# Patient Record
Sex: Male | Born: 1970 | Race: White | Hispanic: No | Marital: Married
Health system: Southern US, Community
[De-identification: ages and names within clinical notes are randomized; demographics above are authoritative.]

---

## 1998-09-17 ENCOUNTER — Ambulatory Visit (HOSPITAL_BASED_OUTPATIENT_CLINIC_OR_DEPARTMENT_OTHER): Admission: RE | Admit: 1998-09-17 | Discharge: 1998-09-17 | Payer: Self-pay | Admitting: General Surgery

## 1999-04-23 ENCOUNTER — Emergency Department (HOSPITAL_COMMUNITY): Admission: EM | Admit: 1999-04-23 | Discharge: 1999-04-23 | Payer: Self-pay

## 2005-05-26 ENCOUNTER — Emergency Department: Payer: Self-pay | Admitting: Emergency Medicine

## 2006-12-24 IMAGING — CR RIGHT FOOT COMPLETE - 3+ VIEW
1 series · 3 of 3 positions shown · non-contrast
Comparison: none

REASON FOR EXAM: Injury
COMMENTS:  LMP: (Male)

PROCEDURE:     DXR - DXR FOOT RT COMPLETE W/OBLIQUES  - May 26, 2005  [DATE]
RESULT:       Three views reveals no acute fractures.  No dislocations.  The
joint spaces appear intact.

[Series 1: view not recorded · 0.17mm/px · 3 of 3 slices shown]
[im 1/3]
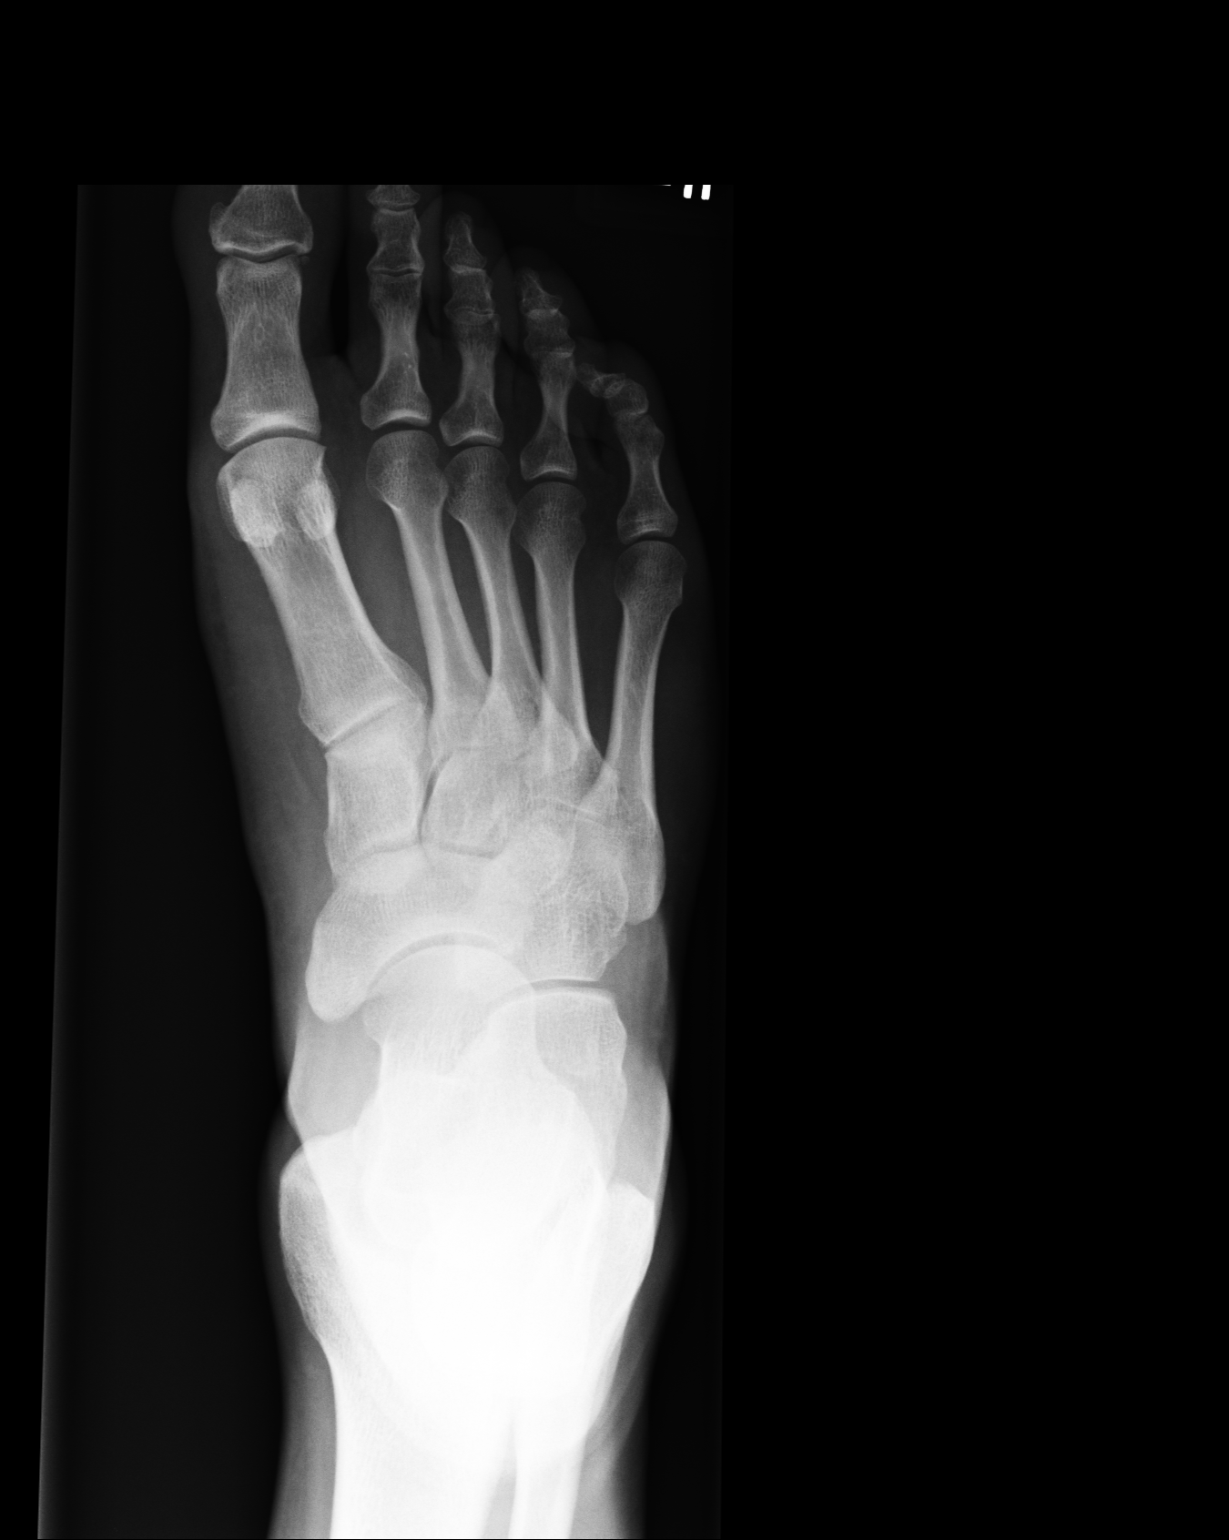
[im 2/3]
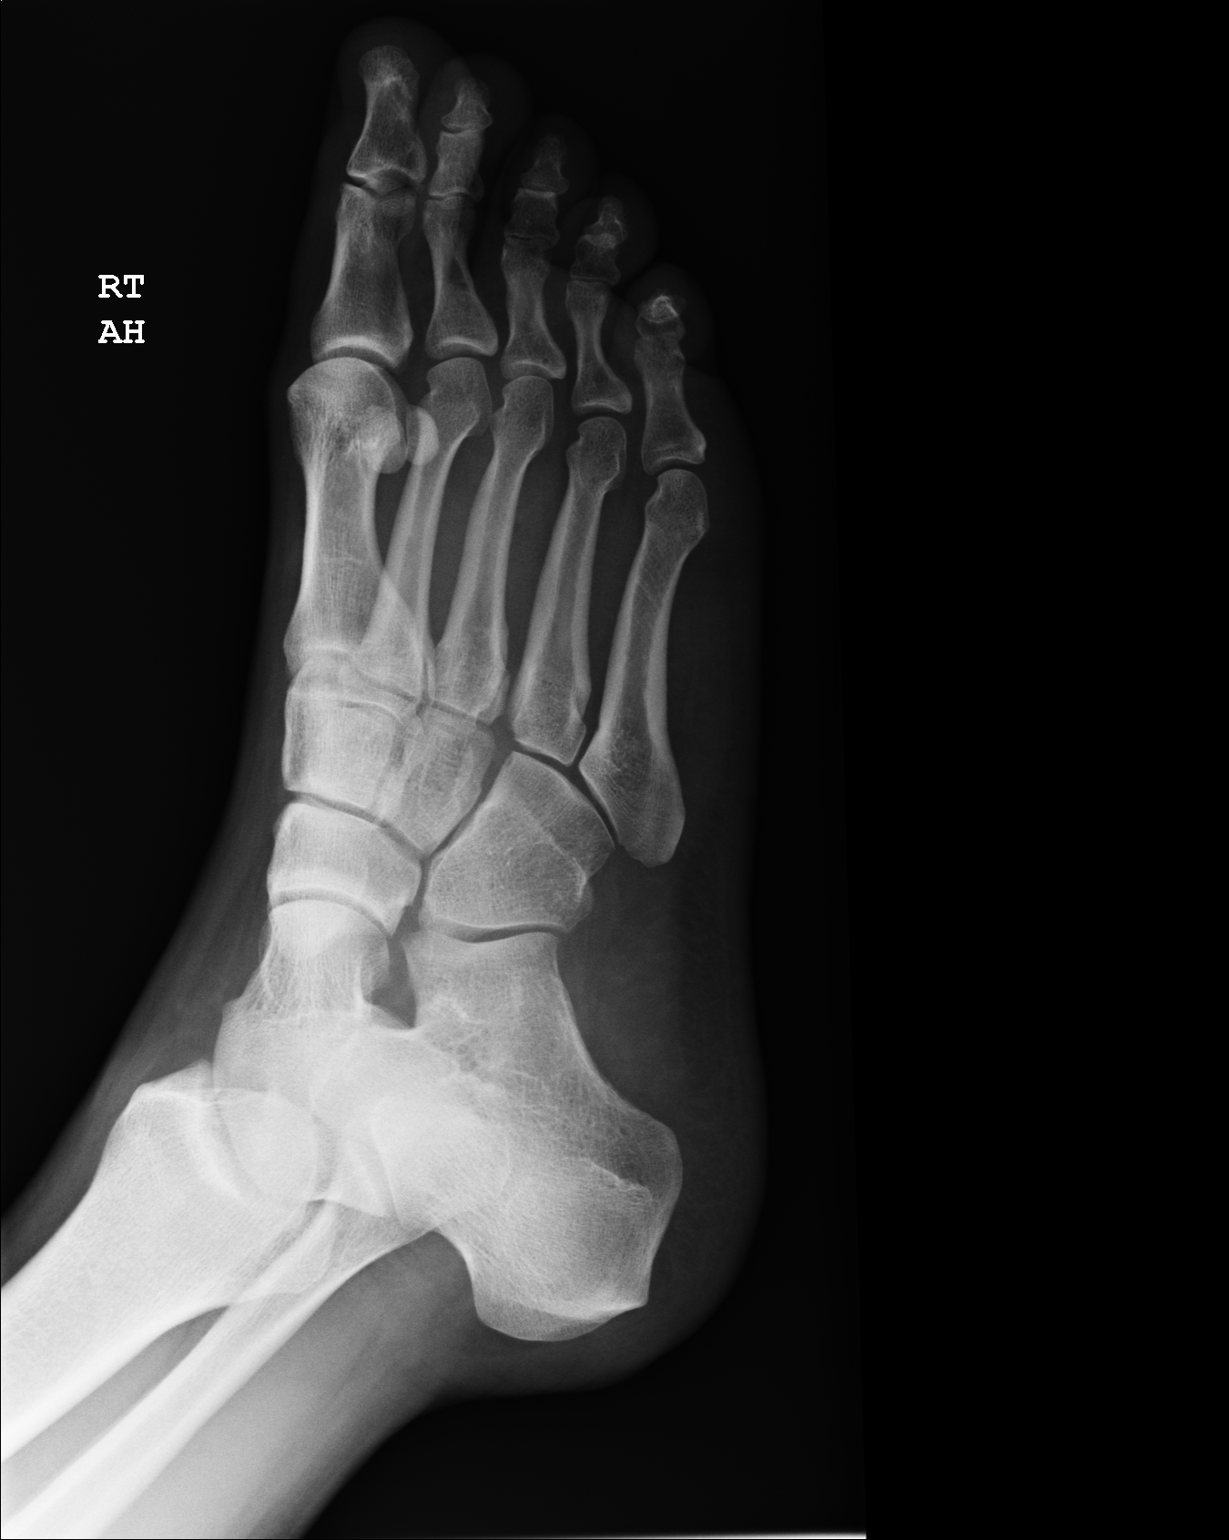
[im 3/3]
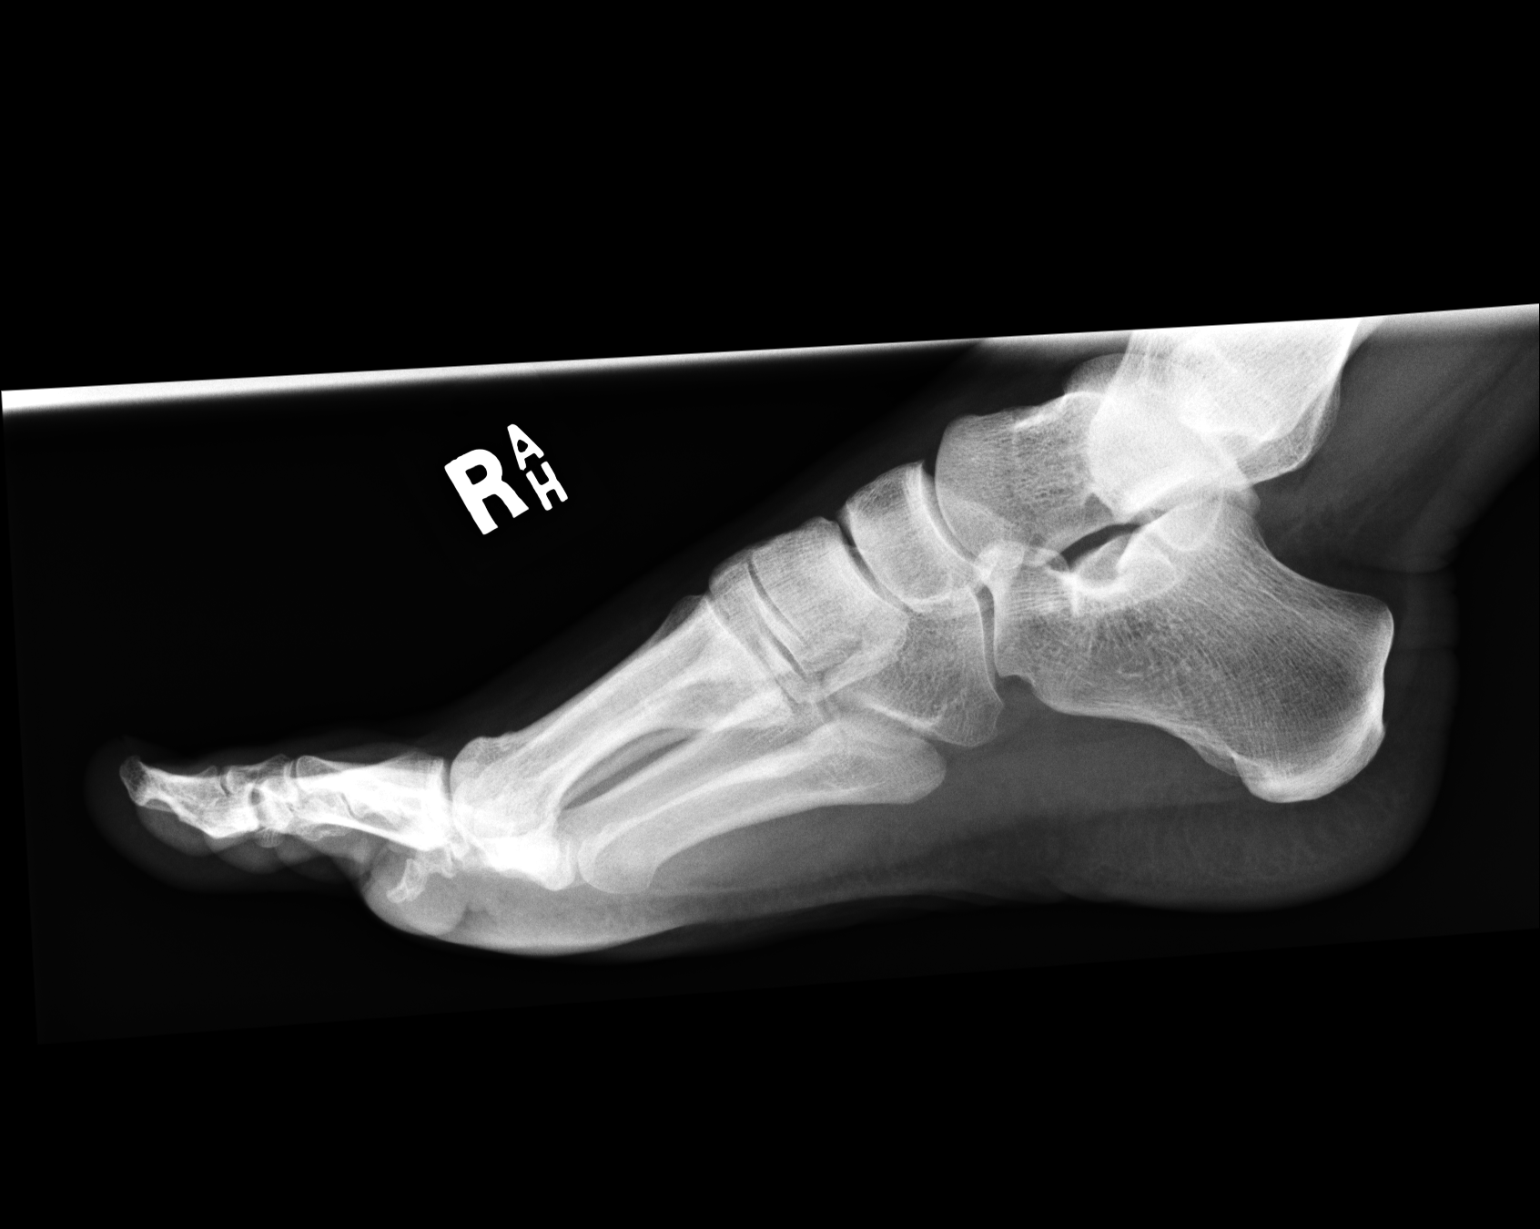

[3 of 3 positions shown; findings below may reference images not displayed]

IMPRESSION: No acute fracture seen of the RIGHT foot.

## 2008-06-25 ENCOUNTER — Encounter (INDEPENDENT_AMBULATORY_CARE_PROVIDER_SITE_OTHER): Payer: Self-pay | Admitting: *Deleted

## 2009-08-29 ENCOUNTER — Telehealth (INDEPENDENT_AMBULATORY_CARE_PROVIDER_SITE_OTHER): Payer: Self-pay | Admitting: *Deleted

## 2010-05-20 NOTE — Progress Notes (Signed)
Summary: Schedule recall colonoscopy  Phone Note Outgoing Call Call back at [facility] Phone [phone] Call back at Work Phone [phone]   Call placed by: Christie Nottingham CMA Duncan Dull),  Aug 29, 2009 2:33 PM Call placed to: Patient Summary of Call: Called pt to schedule recall colon and the home phone number rang with no answer and no voicemail. The work phone has been disconnected. Initial call taken by: Christie Nottingham CMA Duncan Dull),  Aug 29, 2009 2:34 PM  Follow-up for Phone Call        No answer and no voicemail. Letter to be mailed. Follow-up by: Christie Nottingham CMA Duncan Dull),  Sep 04, 2009 9:04 AM

## 2018-07-16 ENCOUNTER — Emergency Department
Admission: EM | Admit: 2018-07-16 | Discharge: 2018-07-17 | Disposition: A | Discharge status: Home / Self Care | Payer: 59 | Attending: Emergency Medicine | Admitting: Emergency Medicine

## 2018-07-16 ENCOUNTER — Other Ambulatory Visit: Payer: Self-pay

## 2018-07-16 DIAGNOSIS — Y92007 Garden or yard of unspecified non-institutional (private) residence as the place of occurrence of the external cause: Secondary | ICD-10-CM | POA: Insufficient documentation

## 2018-07-16 DIAGNOSIS — Z23 Encounter for immunization: Secondary | ICD-10-CM | POA: Diagnosis not present

## 2018-07-16 DIAGNOSIS — Y93K1 Activity, walking an animal: Secondary | ICD-10-CM | POA: Diagnosis not present

## 2018-07-16 DIAGNOSIS — W5911XA Bitten by nonvenomous snake, initial encounter: Secondary | ICD-10-CM | POA: Insufficient documentation

## 2018-07-16 DIAGNOSIS — S91351A Open bite, right foot, initial encounter: Secondary | ICD-10-CM | POA: Insufficient documentation

## 2018-07-16 DIAGNOSIS — I10 Essential (primary) hypertension: Secondary | ICD-10-CM | POA: Insufficient documentation

## 2018-07-16 DIAGNOSIS — Z7984 Long term (current) use of oral hypoglycemic drugs: Secondary | ICD-10-CM | POA: Insufficient documentation

## 2018-07-16 DIAGNOSIS — E119 Type 2 diabetes mellitus without complications: Secondary | ICD-10-CM | POA: Insufficient documentation

## 2018-07-16 DIAGNOSIS — Y999 Unspecified external cause status: Secondary | ICD-10-CM | POA: Diagnosis not present

## 2018-07-16 NOTE — ED Triage Notes (Signed)
Pt arrives with snake bite to left foot, area is marked currently. Pt states happened around 2130. Foot and ankle are swollen.

## 2018-07-17 LAB — CBC WITH DIFFERENTIAL/PLATELET
Abs Immature Granulocytes: 0.02 10*3/uL (ref 0.00–0.07)
Basophils Absolute: 0 10*3/uL (ref 0.0–0.1)
Basophils Relative: 1 %
Eosinophils Absolute: 0.1 10*3/uL (ref 0.0–0.5)
Eosinophils Relative: 3 %
HCT: 39.7 % (ref 39.0–52.0)
Hemoglobin: 13.2 g/dL (ref 13.0–17.0)
IMMATURE GRANULOCYTES: 1 %
Lymphocytes Relative: 23 %
Lymphs Abs: 1 10*3/uL (ref 0.7–4.0)
MCH: 28.3 pg (ref 26.0–34.0)
MCHC: 33.2 g/dL (ref 30.0–36.0)
MCV: 85 fL (ref 80.0–100.0)
Monocytes Absolute: 0.9 10*3/uL (ref 0.1–1.0)
Monocytes Relative: 21 %
NEUTROS PCT: 51 %
NRBC: 0 % (ref 0.0–0.2)
Neutro Abs: 2.3 10*3/uL (ref 1.7–7.7)
Platelets: 215 10*3/uL (ref 150–400)
RBC: 4.67 MIL/uL (ref 4.22–5.81)
RDW: 13 % (ref 11.5–15.5)
WBC: 4.4 10*3/uL (ref 4.0–10.5)

## 2018-07-17 LAB — COMPREHENSIVE METABOLIC PANEL
ALT: 52 U/L — ABNORMAL HIGH (ref 0–44)
ANION GAP: 9 (ref 5–15)
AST: 49 U/L — ABNORMAL HIGH (ref 15–41)
Albumin: 3.9 g/dL (ref 3.5–5.0)
Alkaline Phosphatase: 75 U/L (ref 38–126)
BUN: 14 mg/dL (ref 6–20)
CO2: 26 mmol/L (ref 22–32)
Calcium: 8.5 mg/dL — ABNORMAL LOW (ref 8.9–10.3)
Chloride: 103 mmol/L (ref 98–111)
Creatinine, Ser: 0.92 mg/dL (ref 0.61–1.24)
GFR calc non Af Amer: >60 mL/min (ref >60)
Glucose, Bld: 209 mg/dL — ABNORMAL HIGH (ref 70–99)
Potassium: 3.6 mmol/L (ref 3.5–5.1)
Sodium: 138 mmol/L (ref 135–145)
Total Bilirubin: 0.3 mg/dL (ref 0.3–1.2)
Total Protein: 6.6 g/dL (ref 6.5–8.1)

## 2018-07-17 LAB — FIBRINOGEN: FIBRINOGEN: 278 mg/dL (ref 210–475)

## 2018-07-17 LAB — APTT: aPTT: 25 seconds (ref 24–36)

## 2018-07-17 LAB — PROTIME-INR
INR: 0.9 (ref 0.8–1.2)
Prothrombin Time: 12.5 seconds (ref 11.4–15.2)

## 2018-07-17 MED ORDER — ONDANSETRON HCL 4 MG/2ML IJ SOLN
4.0000 mg | Freq: Once | INTRAMUSCULAR | Status: AC
  Administered 2018-07-17: 4 mg via INTRAVENOUS
  Filled 2018-07-17: qty 2

## 2018-07-17 MED ORDER — OXYCODONE-ACETAMINOPHEN 5-325 MG PO TABS: 1.0000 | ORAL_TABLET | ORAL | 0 refills | Status: AC | PRN

## 2018-07-17 MED ORDER — OXYCODONE-ACETAMINOPHEN 5-325 MG PO TABS: 1.0000 | ORAL_TABLET | ORAL | 0 refills | Status: DC | PRN

## 2018-07-17 MED ORDER — TETANUS-DIPHTH-ACELL PERTUSSIS 5-2.5-18.5 LF-MCG/0.5 IM SUSP
0.5000 mL | Freq: Once | INTRAMUSCULAR | Status: AC
  Administered 2018-07-17: 0.5 mL via INTRAMUSCULAR
  Filled 2018-07-17: qty 0.5

## 2018-07-17 MED ORDER — MORPHINE SULFATE (PF) 4 MG/ML IV SOLN
4.0000 mg | Freq: Once | INTRAVENOUS | Status: AC
Start: 2018-07-17 — End: 2018-07-17
  Administered 2018-07-17: 4 mg via INTRAVENOUS
  Filled 2018-07-17: qty 1

## 2018-07-17 NOTE — Discharge Instructions (Signed)
Please keep your foot elevated as much as possible over your heart for the rest of today.  Return for increasing painn swelling moving up the leg or  numbness of the toes.  For bad pain you can take Percocet 1 pill 4 times a day as needed.  Be careful can make you sleepy and woozy.  Do not fall.  Do not drive on it.  I will give you a work note for today and tomorrow.  Please additionally see the instructions from poison control that I have included with this.

## 2018-07-17 NOTE — ED Provider Notes (Signed)
[facility] Provider Note   ____________________________________________   First MD Initiated Contact with Patient 07/16/18 2308     (approximate)  I have reviewed the triage vital signs and the nursing notes.   HISTORY  Chief Complaint Animal Bite    HPI Bobby Weber is a 48 y.o. male who reports he was bitten by a snake at 930.  The foot began swelling up.  Swollen up to just above the ankle male.  Really has not changed that much since about 11:00.  Patient control recommends 8 hours of observation and checking labs.  Patient reports he was out walking his dog in the backyard when he was bitten.           Patient reports past medical history is hypertension Diabetes controlled with metformin once a day and Hypercholesterolemia There are no active problems to display for this patient.   Prior to Admission medications   Medication Sig Start Date End Date Taking? Authorizing Provider  oxyCODONE-acetaminophen (PERCOCET) 5-325 MG tablet Take 1 tablet by mouth every 4 (four) hours as needed for severe pain. 07/17/18 07/17/19  Arnaldo Natal, MD  Metformin  Allergies Patient has no known allergies.  No family history on file.  Social History Social History   Tobacco Use  . Smoking status: Not on file  Substance Use Topics  . Alcohol use: Not on file  . Drug use: Not on file    Review of Systems  Constitutional: No fever/chills Eyes: No visual changes. ENT: No sore throat. Cardiovascular: Denies chest pain. Respiratory: Denies shortness of breath. Gastrointestinal: No abdominal pain.  No nausea, no vomiting.  No diarrhea.  No constipation. Genitourinary: Negative for dysuria. Musculoskeletal: Negative for back pain. Skin: Negative for rash.  He does have the swelling on his right leg. Neurological: Negative for headaches, focal weakness  ____________________________________________   PHYSICAL EXAM:   VITAL SIGNS: ED Triage Vitals  Enc Vitals Group     BP 07/16/18 2218 122/85     Pulse Rate 07/16/18 2218 89     Resp 07/16/18 2218 16     Temp 07/16/18 2218 98.3 F (36.8 C)     Temp Source 07/16/18 2218 Oral     SpO2 07/16/18 2218 96 %     Weight 07/16/18 2219 250 lb (113.4 kg)     Height 07/16/18 2219 5\' 7"  (1.702 m)     Head Circumference --      Peak Flow --      Pain Score 07/16/18 2218 4     Pain Loc --      Pain Edu? --      Excl. in GC? --     Constitutional: Alert and oriented. Well appearing and in no acute distress. Eyes: Conjunctivae are normal.  Head: Atraumatic. Nose: No congestion/rhinnorhea. Mouth/Throat: Mucous membranes are moist.  Oropharynx non-erythematous. Neck: No stridor.   Cardiovascular: Normal rate, regular rhythm. Grossly normal heart sounds.  Good peripheral circulation. Respiratory: Normal respiratory effort.  No retractions. Lungs CTAB. Gastrointestinal: Soft and nontender. No distention. No abdominal bruits. No CVA tenderness. Musculoskeletal: Left leg is normal right foot is swollen and tender with 2 puncture marks medially on the top of the foot.  The swelling goes up the medial part of the ankle.  It has not progressed up the leg since about 1230.  It has gotten bigger though  eurologic:  Normal speech and language. No gross focal neurologic deficits are appreciated.  Skin:  Skin is warm, dry and intact.  Except for puncture wound and swelling on the right foot.   ____________________________________________   LABS (all labs ordered are listed, but only abnormal results are displayed)  Labs Reviewed  COMPREHENSIVE METABOLIC PANEL - Abnormal; Notable for the following components:      Result Value   Glucose, Bld 209 (*)    Calcium 8.5 (*)    AST 49 (*)    ALT 52 (*)    All other components within normal limits  PROTIME-INR  APTT  CBC WITH DIFFERENTIAL/PLATELET  FIBRINOGEN   ____________________________________________  EKG    ____________________________________________  RADIOLOGY  ED MD interpretation:    Official radiology report(s): No results found.  ____________________________________________  PROCEDURES  Procedure(s) performed (including Critical Care):  Procedures   ____________________________________________   INITIAL IMPRESSION / ASSESSMENT AND PLAN / ED COURSE  ----------------------------------------- 6:10 AM on 07/17/2018 -----------------------------------------  Patient has swelling in the foot but not really in the toes not really above the ankle.  The foot itself is soft with good capillary refill in the toes there is no change in the size of his calf.  Patient has no pain unless he tries to walk and that it does hurt.  Labs so far been normal.  Poison control says if his fibrinogen which is still pending comes back normal and his pain can be controlled with oral medications he can go home.     ____________________________________________   FINAL CLINICAL IMPRESSION(S) / ED DIAGNOSES  Final diagnoses:  Snake bite, initial encounter     ED Discharge Orders         Ordered    oxyCODONE-acetaminophen (PERCOCET) 5-325 MG tablet  Every 4 hours PRN,   Status:  Discontinued     07/17/18 0653    oxyCODONE-acetaminophen (PERCOCET) 5-325 MG tablet  Every 4 hours PRN,   Status:  Discontinued     07/17/18 0654    oxyCODONE-acetaminophen (PERCOCET) 5-325 MG tablet  Every 4 hours PRN     07/17/18 0656           Note:  This document was prepared using Dragon voice recognition software and may include unintentional dictation errors.    Arnaldo Natal, MD 07/17/18 [phone]

## 2018-07-17 NOTE — ED Notes (Signed)
Pt called out when I was walking past his room; pt given warm blanket and pillow for comfort; call bell in reach and encouraged to use for any needs

## 2023-03-15 ENCOUNTER — Other Ambulatory Visit: Payer: Self-pay | Admitting: Physician Assistant

## 2023-03-15 ENCOUNTER — Ambulatory Visit
Admission: RE | Admit: 2023-03-15 | Discharge: 2023-03-15 | Disposition: A | Discharge status: Home / Self Care | Payer: 59 | Attending: Physician Assistant | Admitting: Physician Assistant

## 2023-03-15 ENCOUNTER — Ambulatory Visit
Admission: RE | Admit: 2023-03-15 | Discharge: 2023-03-15 | Disposition: A | Discharge status: Home / Self Care | Payer: 59 | Source: Ambulatory Visit | Attending: Physician Assistant | Admitting: Physician Assistant

## 2023-03-15 DIAGNOSIS — G8929 Other chronic pain: Secondary | ICD-10-CM | POA: Insufficient documentation

## 2023-03-15 DIAGNOSIS — M25561 Pain in right knee: Secondary | ICD-10-CM

## 2023-03-15 DIAGNOSIS — M25571 Pain in right ankle and joints of right foot: Secondary | ICD-10-CM | POA: Diagnosis present

## 2023-03-15 DIAGNOSIS — M25562 Pain in left knee: Secondary | ICD-10-CM | POA: Insufficient documentation

## 2023-03-15 DIAGNOSIS — M25572 Pain in left ankle and joints of left foot: Secondary | ICD-10-CM | POA: Diagnosis present
